# Patient Record
Sex: Male | Born: 1935 | Race: Black or African American | Hispanic: No | Marital: Married | Smoking: Former smoker
Health system: Southern US, Community
[De-identification: ages and names within clinical notes are randomized; demographics above are authoritative.]

## PROBLEM LIST (undated history)

## (undated) DIAGNOSIS — E7800 Pure hypercholesterolemia, unspecified: Secondary | ICD-10-CM

## (undated) DIAGNOSIS — I10 Essential (primary) hypertension: Secondary | ICD-10-CM

## (undated) DIAGNOSIS — C61 Malignant neoplasm of prostate: Secondary | ICD-10-CM

## (undated) HISTORY — PX: PACEMAKER INSERTION: SHX728

## (undated) HISTORY — PX: INSERTION PROSTATE RADIATION SEED: SUR718

---

## 2014-04-07 ENCOUNTER — Emergency Department (HOSPITAL_BASED_OUTPATIENT_CLINIC_OR_DEPARTMENT_OTHER): Payer: BC Managed Care – PPO

## 2014-04-07 ENCOUNTER — Observation Stay (HOSPITAL_BASED_OUTPATIENT_CLINIC_OR_DEPARTMENT_OTHER)
Admission: EM | Admit: 2014-04-07 | Discharge: 2014-04-08 | Disposition: A | Discharge status: Home / Self Care | Payer: BC Managed Care – PPO | Attending: Internal Medicine | Admitting: Internal Medicine

## 2014-04-07 ENCOUNTER — Encounter (HOSPITAL_BASED_OUTPATIENT_CLINIC_OR_DEPARTMENT_OTHER): Payer: Self-pay | Admitting: Emergency Medicine

## 2014-04-07 DIAGNOSIS — E785 Hyperlipidemia, unspecified: Secondary | ICD-10-CM | POA: Diagnosis present

## 2014-04-07 DIAGNOSIS — Z950 Presence of cardiac pacemaker: Secondary | ICD-10-CM | POA: Diagnosis not present

## 2014-04-07 DIAGNOSIS — Z87891 Personal history of nicotine dependence: Secondary | ICD-10-CM | POA: Insufficient documentation

## 2014-04-07 DIAGNOSIS — Z7982 Long term (current) use of aspirin: Secondary | ICD-10-CM | POA: Insufficient documentation

## 2014-04-07 DIAGNOSIS — Z8546 Personal history of malignant neoplasm of prostate: Secondary | ICD-10-CM | POA: Insufficient documentation

## 2014-04-07 DIAGNOSIS — R079 Chest pain, unspecified: Secondary | ICD-10-CM | POA: Diagnosis present

## 2014-04-07 DIAGNOSIS — R1013 Epigastric pain: Secondary | ICD-10-CM | POA: Insufficient documentation

## 2014-04-07 DIAGNOSIS — F1210 Cannabis abuse, uncomplicated: Secondary | ICD-10-CM | POA: Diagnosis not present

## 2014-04-07 DIAGNOSIS — R0789 Other chest pain: Principal | ICD-10-CM | POA: Insufficient documentation

## 2014-04-07 DIAGNOSIS — I10 Essential (primary) hypertension: Secondary | ICD-10-CM | POA: Diagnosis present

## 2014-04-07 HISTORY — DX: Pure hypercholesterolemia, unspecified: E78.00

## 2014-04-07 HISTORY — DX: Malignant neoplasm of prostate: C61

## 2014-04-07 HISTORY — DX: Essential (primary) hypertension: I10

## 2014-04-07 LAB — CBC WITH DIFFERENTIAL/PLATELET
Basophils Absolute: 0 10*3/uL (ref 0.0–0.1)
Basophils Relative: 0 % (ref 0–1)
Eosinophils Absolute: 0.1 10*3/uL (ref 0.0–0.7)
Eosinophils Relative: 2 % (ref 0–5)
HCT: 42.9 % (ref 39.0–52.0)
HEMOGLOBIN: 14.9 g/dL (ref 13.0–17.0)
LYMPHS ABS: 3.5 10*3/uL (ref 0.7–4.0)
LYMPHS PCT: 46 % (ref 12–46)
MCH: 29.6 pg (ref 26.0–34.0)
MCHC: 34.7 g/dL (ref 30.0–36.0)
MCV: 85.3 fL (ref 78.0–100.0)
MONOS PCT: 10 % (ref 3–12)
Monocytes Absolute: 0.7 10*3/uL (ref 0.1–1.0)
NEUTROS PCT: 43 % (ref 43–77)
Neutro Abs: 3.3 10*3/uL (ref 1.7–7.7)
PLATELETS: 210 10*3/uL (ref 150–400)
RBC: 5.03 MIL/uL (ref 4.22–5.81)
RDW: 16.3 % — ABNORMAL HIGH (ref 11.5–15.5)
WBC: 7.6 10*3/uL (ref 4.0–10.5)

## 2014-04-07 LAB — COMPREHENSIVE METABOLIC PANEL
ALK PHOS: 73 U/L (ref 39–117)
ALT: 11 U/L (ref 0–53)
AST: 19 U/L (ref 0–37)
Albumin: 3.9 g/dL (ref 3.5–5.2)
Anion gap: 11 (ref 5–15)
BILIRUBIN TOTAL: 0.8 mg/dL (ref 0.3–1.2)
BUN: 11 mg/dL (ref 6–23)
CO2: 31 mEq/L (ref 19–32)
Calcium: 9.9 mg/dL (ref 8.4–10.5)
Chloride: 102 mEq/L (ref 96–112)
Creatinine, Ser: 1.3 mg/dL (ref 0.50–1.35)
GFR calc non Af Amer: 51 mL/min — ABNORMAL LOW (ref >90)
GFR, EST AFRICAN AMERICAN: 59 mL/min — AB (ref >90)
GLUCOSE: 107 mg/dL — AB (ref 70–99)
POTASSIUM: 4.3 meq/L (ref 3.7–5.3)
Sodium: 144 mEq/L (ref 137–147)
TOTAL PROTEIN: 7.8 g/dL (ref 6.0–8.3)

## 2014-04-07 LAB — TROPONIN I: Troponin I: <0.3 ng/mL (ref <0.30)

## 2014-04-07 MED ORDER — ASPIRIN 81 MG PO CHEW
324.0000 mg | CHEWABLE_TABLET | Freq: Once | ORAL | Status: AC
  Administered 2014-04-07: 324 mg via ORAL
  Filled 2014-04-07: qty 4

## 2014-04-07 MED ORDER — ONDANSETRON HCL 4 MG/2ML IJ SOLN
4.0000 mg | Freq: Once | INTRAMUSCULAR | Status: AC
  Administered 2014-04-07: 4 mg via INTRAVENOUS
  Filled 2014-04-07: qty 2

## 2014-04-07 MED ORDER — FENTANYL CITRATE 0.05 MG/ML IJ SOLN
50.0000 ug | Freq: Once | INTRAMUSCULAR | Status: AC
  Administered 2014-04-07: 50 ug via INTRAVENOUS
  Filled 2014-04-07: qty 2

## 2014-04-07 NOTE — ED Notes (Signed)
C/o abd pain, chest pain x 30 min-vomited x 1

## 2014-04-07 NOTE — ED Provider Notes (Signed)
CSN: [id number]     Arrival date & time 04/07/14  2154 History   None    This chart was scribed for Shaun Hechavarria Alfonso Patten, MD by Forrestine Him, ED Scribe. This patient was seen in room MH12/MH12 and the patient's care was started 11:06 PM.   Chief Complaint  Patient presents with  . Chest Pain  . Abdominal Pain   Patient is a 80 y.o. male presenting with chest pain. The history is provided by the patient. No language interpreter was used.  Chest Pain Pain location:  Substernal area Pain quality: dull   Pain radiates to:  Does not radiate Pain radiates to the back: no   Pain severity:  Severe Onset quality:  Sudden Timing:  Constant Progression:  Unchanged Chronicity:  New Context: at rest   Context: not breathing   Relieved by:  Nothing Worsened by:  Nothing tried Ineffective treatments:  None tried Associated symptoms: abdominal pain and vomiting   Associated symptoms: no fever and no palpitations   Risk factors: no prior DVT/PE     HPI Comments: Shaun Miller is a 80 y.o. male with a PMHx of HTN, high cholesterol, and current pacemaker placement who presents to the Emergency Department complaining of constant, moderate chest pain onset 30 minutes prior to arrival with 1 episode of associated vomiting. He describes discomfort as tight and currently rates it 7-8/10. Pt also reports nausea and some mild SOB. No aggravating or alleviating factors at this time. Wife states he had a "heavy vegetable dinner" he is not typically used to. He was given 1 dose of Ramipril without any improvement for symptoms. No known allergies to medications. Pt is originally from [place] and here for vacation.  [facility] - Cardiology   Past Medical History  Diagnosis Date  . Hypertension   . High cholesterol    Past Surgical History  Procedure Laterality Date  . Pacemaker insertion     No family history on file. History  Substance Use Topics  . Smoking status: Former Research scientist (life sciences)  .  Smokeless tobacco: Not on file  . Alcohol Use: Yes    Review of Systems  Constitutional: Negative for fever and chills.  Cardiovascular: Positive for chest pain. Negative for palpitations and leg swelling.  Gastrointestinal: Positive for vomiting and abdominal pain.  All other systems reviewed and are negative.     Allergies  Review of patient's allergies indicates no known allergies.  Home Medications   Prior to Admission medications   Medication Sig Start Date End Date Taking? Authorizing Provider  aspirin 81 MG tablet Take 81 mg by mouth daily.   Yes Historical Provider, MD  FEXOFENADINE HCL PO Take by mouth.   Yes Historical Provider, MD  METOPROLOL TARTRATE PO Take by mouth.   Yes Historical Provider, MD  RAMIPRIL PO Take by mouth.   Yes Historical Provider, MD  Ranitidine HCl (RANITIDINE ACID REDUCER PO) Take by mouth.   Yes Historical Provider, MD  Rosuvastatin Calcium (CRESTOR PO) Take by mouth.   Yes Historical Provider, MD   Triage Vitals: BP 150/78  Pulse 62  Temp(Src) 98.8 F (37.1 C) (Oral)  Resp 19  Ht 6' (1.829 m)  Wt 145 lb (65.772 kg)  BMI 19.66 kg/m2  SpO2 100%   Physical Exam  Nursing note and vitals reviewed. Constitutional: He is oriented to person, place, and time. He appears well-developed and well-nourished.  HENT:  Head: Normocephalic and atraumatic.  Mouth/Throat: Oropharynx is clear and moist.  Eyes: Conjunctivae  and EOM are normal. Pupils are equal, round, and reactive to light.  Neck: Normal range of motion. Neck supple.  Cardiovascular: Normal rate, regular rhythm, normal heart sounds and intact distal pulses.   Pulmonary/Chest: Effort normal and breath sounds normal. No respiratory distress.  Abdominal: Soft. Bowel sounds are normal. He exhibits no distension. There is no tenderness. There is no rebound and no guarding.  Musculoskeletal: Normal range of motion.  Neurological: He is alert and oriented to person, place, and time. He has  normal reflexes.  Skin: Skin is warm and dry. He is not diaphoretic.  Psychiatric: He has a normal mood and affect. Judgment normal.    ED Course  Procedures (including critical care time)  DIAGNOSTIC STUDIES: Oxygen Saturation is 100% on RA, Normal by my interpretation.    COORDINATION OF CARE: 11:15 PM-Discussed treatment plan with pt at bedside and pt agreed to plan.     Labs Review Labs Reviewed  CBC WITH DIFFERENTIAL - Abnormal; Notable for the following:    RDW 16.3 (*)    All other components within normal limits  TROPONIN I  COMPREHENSIVE METABOLIC PANEL    Imaging Review No results found.   EKG Interpretation None      MDM   Final diagnoses:  None     Date: 04/07/2014  Rate: 60  Rhythm: paced  QRS Axis: left  Intervals: PR prolonged  ST/T Wave abnormalities: nonspecific ST changes  Conduction Disutrbances:left bundle branch block  Narrative Interpretation:   Old EKG Reviewed: none available    I personally performed the services described in this documentation, which was scribed in my presence. The recorded information has been reviewed and is accurate.    Carlisle Beers, MD 04/07/14 [phone]

## 2014-04-08 ENCOUNTER — Encounter (HOSPITAL_COMMUNITY): Payer: Self-pay | Admitting: Internal Medicine

## 2014-04-08 DIAGNOSIS — E785 Hyperlipidemia, unspecified: Secondary | ICD-10-CM | POA: Diagnosis present

## 2014-04-08 DIAGNOSIS — R079 Chest pain, unspecified: Secondary | ICD-10-CM

## 2014-04-08 DIAGNOSIS — R0789 Other chest pain: Secondary | ICD-10-CM | POA: Diagnosis not present

## 2014-04-08 DIAGNOSIS — I10 Essential (primary) hypertension: Secondary | ICD-10-CM | POA: Diagnosis not present

## 2014-04-08 DIAGNOSIS — R1013 Epigastric pain: Secondary | ICD-10-CM | POA: Diagnosis not present

## 2014-04-08 LAB — CBC WITH DIFFERENTIAL/PLATELET
Basophils Absolute: 0 10*3/uL (ref 0.0–0.1)
Basophils Relative: 0 % (ref 0–1)
Eosinophils Absolute: 0 10*3/uL (ref 0.0–0.7)
Eosinophils Relative: 0 % (ref 0–5)
HCT: 41 % (ref 39.0–52.0)
HEMOGLOBIN: 13.9 g/dL (ref 13.0–17.0)
LYMPHS ABS: 1.2 10*3/uL (ref 0.7–4.0)
Lymphocytes Relative: 13 % (ref 12–46)
MCH: 29.6 pg (ref 26.0–34.0)
MCHC: 33.9 g/dL (ref 30.0–36.0)
MCV: 87.4 fL (ref 78.0–100.0)
MONOS PCT: 8 % (ref 3–12)
Monocytes Absolute: 0.7 10*3/uL (ref 0.1–1.0)
NEUTROS ABS: 7 10*3/uL (ref 1.7–7.7)
Neutrophils Relative %: 79 % — ABNORMAL HIGH (ref 43–77)
Platelets: 185 10*3/uL (ref 150–400)
RBC: 4.69 MIL/uL (ref 4.22–5.81)
RDW: 15.6 % — ABNORMAL HIGH (ref 11.5–15.5)
WBC: 8.8 10*3/uL (ref 4.0–10.5)

## 2014-04-08 LAB — COMPREHENSIVE METABOLIC PANEL
ALBUMIN: 3.4 g/dL — AB (ref 3.5–5.2)
ALT: 10 U/L (ref 0–53)
ANION GAP: 12 (ref 5–15)
AST: 16 U/L (ref 0–37)
Alkaline Phosphatase: 66 U/L (ref 39–117)
BUN: 12 mg/dL (ref 6–23)
CALCIUM: 8.6 mg/dL (ref 8.4–10.5)
CO2: 26 mEq/L (ref 19–32)
Chloride: 102 mEq/L (ref 96–112)
Creatinine, Ser: 1.13 mg/dL (ref 0.50–1.35)
GFR calc Af Amer: 69 mL/min — ABNORMAL LOW (ref >90)
GFR calc non Af Amer: 60 mL/min — ABNORMAL LOW (ref >90)
GLUCOSE: 96 mg/dL (ref 70–99)
Potassium: 4.3 mEq/L (ref 3.7–5.3)
SODIUM: 140 meq/L (ref 137–147)
TOTAL PROTEIN: 6.6 g/dL (ref 6.0–8.3)
Total Bilirubin: 1 mg/dL (ref 0.3–1.2)

## 2014-04-08 LAB — LIPASE, BLOOD: LIPASE: 18 U/L (ref 11–59)

## 2014-04-08 LAB — TROPONIN I: Troponin I: <0.3 ng/mL (ref <0.30)

## 2014-04-08 LAB — TSH: TSH: 0.485 u[IU]/mL (ref 0.350–4.500)

## 2014-04-08 LAB — MRSA PCR SCREENING: MRSA BY PCR: NEGATIVE

## 2014-04-08 MED ORDER — ASPIRIN EC 325 MG PO TBEC
325.0000 mg | DELAYED_RELEASE_TABLET | Freq: Every day | ORAL | Status: DC
  Administered 2014-04-08: 325 mg via ORAL
  Filled 2014-04-08: qty 1

## 2014-04-08 MED ORDER — RAMIPRIL 5 MG PO CAPS
5.0000 mg | ORAL_CAPSULE | Freq: Every day | ORAL | Status: DC
  Administered 2014-04-08: 5 mg via ORAL
  Filled 2014-04-08: qty 1

## 2014-04-08 MED ORDER — ONDANSETRON HCL 4 MG/2ML IJ SOLN: 4.0000 mg | Freq: Four times a day (QID) | INTRAMUSCULAR | Status: DC | PRN

## 2014-04-08 MED ORDER — SODIUM CHLORIDE 0.9 % IV SOLN
INTRAVENOUS | Status: DC
  Administered 2014-04-08: 08:00:00 via INTRAVENOUS

## 2014-04-08 MED ORDER — ONDANSETRON HCL 4 MG PO TABS
4.0000 mg | ORAL_TABLET | Freq: Four times a day (QID) | ORAL | Status: DC | PRN
Start: 2014-04-08 — End: 2014-04-08

## 2014-04-08 MED ORDER — METOPROLOL SUCCINATE ER 50 MG PO TB24
50.0000 mg | ORAL_TABLET | Freq: Every day | ORAL | Status: DC
  Filled 2014-04-08: qty 1

## 2014-04-08 MED ORDER — ACETAMINOPHEN 325 MG PO TABS
650.0000 mg | ORAL_TABLET | Freq: Four times a day (QID) | ORAL | Status: DC | PRN
  Administered 2014-04-08: 650 mg via ORAL
  Filled 2014-04-08: qty 2

## 2014-04-08 MED ORDER — PANTOPRAZOLE SODIUM 40 MG IV SOLR
40.0000 mg | INTRAVENOUS | Status: DC
Start: 2014-04-08 — End: 2014-04-08
  Administered 2014-04-08: 40 mg via INTRAVENOUS
  Filled 2014-04-08: qty 40

## 2014-04-08 MED ORDER — ASPIRIN 81 MG PO CHEW: 81.0000 mg | CHEWABLE_TABLET | Freq: Every day | ORAL | Status: DC

## 2014-04-08 MED ORDER — RAMIPRIL 5 MG PO CAPS
5.0000 mg | ORAL_CAPSULE | Freq: Every day | ORAL | Status: DC
  Filled 2014-04-08: qty 1

## 2014-04-08 MED ORDER — ACETAMINOPHEN 650 MG RE SUPP: 650.0000 mg | Freq: Four times a day (QID) | RECTAL | Status: DC | PRN

## 2014-04-08 MED ORDER — ENOXAPARIN SODIUM 40 MG/0.4ML ~~LOC~~ SOLN
40.0000 mg | SUBCUTANEOUS | Status: DC
  Filled 2014-04-08: qty 0.4

## 2014-04-08 MED ORDER — PANTOPRAZOLE SODIUM 40 MG PO TBEC: 40.0000 mg | DELAYED_RELEASE_TABLET | Freq: Every day | ORAL | Status: DC

## 2014-04-08 MED ORDER — MORPHINE SULFATE 2 MG/ML IJ SOLN: 1.0000 mg | INTRAMUSCULAR | Status: DC | PRN

## 2014-04-08 MED ORDER — NITROGLYCERIN 0.4 MG/HR TD PT24
0.4000 mg | MEDICATED_PATCH | Freq: Every day | TRANSDERMAL | Status: DC
  Filled 2014-04-08: qty 1

## 2014-04-08 MED ORDER — ATORVASTATIN CALCIUM 10 MG PO TABS
10.0000 mg | ORAL_TABLET | Freq: Every day | ORAL | Status: DC
  Filled 2014-04-08: qty 1

## 2014-04-08 NOTE — Progress Notes (Signed)
Pt discharged via wheelchair, education completed and went over medication list.  Ledyard Blas. Dalbert Batman, RN, BSN 04/08/2014 2:11 PM

## 2014-04-08 NOTE — Progress Notes (Signed)
Pt seen and examined,  Admitted earlier this am with Atypical chest pressure and epigastric pain -reported has had LHC and Stress tests in [place] in the last 5-10years, Cardiologist is Dr.Khalighi Koroush in [place], request records -will keep NPO, request Cards eval, cycle cardiac enzymes -check RUQ [place], LFTs normal, lipase pending  Domenic Polite, MD [phone]

## 2014-04-08 NOTE — H&P (Signed)
[facility] History and Physical  Shaun Miller KKX:[id number] DOB: [birth year] DOA: 04/07/2014  Referring physician: ER physician. PCP: PROVIDER NOT IN SYSTEM   Patient is visiting here from [place] for a reunion. Patient's cardiologist is in [place].  Chief Complaint: Chest pain.  HPI: Shaun Miller is a 80 y.o. male with history of pacemaker placement, hypertension, hyperlipidemia presented to the ER with complaints of chest pain. Patient was having dinner last night around 7:30 PM when he started developing abdominal discomfort which was radiating all the way to the chest and patient had thrown up once. Denies any associated shortness of breath cough fever chills. Pain was retrosternal persistent. In the ER as per the ER physician patient's EKG was unremarkable and cardiac markers were negative but patient had persistent chest pain. On exam pain still has chest pain is retrosternal nonradiating pressure-like. Patient states his abdominal pain is resolved. Has not had any previous episodes of chest pain as per the patient and has had previous stress test done the date of which patient is not able to exactly recall.   Review of Systems: As presented in the history of presenting illness, rest negative.  Past Medical History  Diagnosis Date  . Hypertension   . High cholesterol   . Prostate cancer    Past Surgical History  Procedure Laterality Date  . Pacemaker insertion    . Insertion prostate radiation seed     Social History:  reports that he has quit smoking. He does not have any smokeless tobacco history on file. He reports that he drinks alcohol. He reports that he uses illicit drugs (Marijuana). Where does patient live home. Can patient participate in ADLs? Yes.  No Known Allergies  Family History:  Family History  Problem Relation Age of Onset  . Stroke Mother       Prior to Admission medications   Medication Sig Start Date End Date Taking?  Authorizing Provider  aspirin 81 MG tablet Take 81 mg by mouth daily.   Yes Historical Provider, MD  FEXOFENADINE HCL PO Take by mouth.   Yes Historical Provider, MD  METOPROLOL TARTRATE PO Take by mouth.   Yes Historical Provider, MD  RAMIPRIL PO Take by mouth.   Yes Historical Provider, MD  Ranitidine HCl (RANITIDINE ACID REDUCER PO) Take by mouth.   Yes Historical Provider, MD  Rosuvastatin Calcium (CRESTOR PO) Take by mouth.   Yes Historical Provider, MD    Physical Exam: Filed Vitals:   04/08/14 0013 04/08/14 0300 04/08/14 0400 04/08/14 0500  BP: 133/73 143/71 144/65 133/59  Pulse: 69 68 70 66  Temp:  98.2 F (36.8 C) 98.1 F (36.7 C)   TempSrc:  Oral Oral   Resp:  15 15 15   Height:  6' (1.829 m)    Weight:  66.9 kg (147 lb 7.8 oz)    SpO2: 99% 98% 97% 97%     General:  Moderately developed and nourished.  Eyes: Anicteric no pallor.  ENT: No discharge from ears eyes nose mouth.  Neck: No mass felt.  Cardiovascular: S1-S2 heard.  Respiratory: No rhonchi or crepitations.  Abdomen: Soft nontender bowel sounds present. No guarding rigidity.  Skin: No rash.  Musculoskeletal: No edema.  Psychiatric: Appears normal.  Neurologic: Alert awake oriented time place and person. Moves all extremities.  Labs on Admission:  Basic Metabolic Panel:  Recent Labs Lab 04/07/14 2210  NA 144  K 4.3  CL 102  CO2 31  GLUCOSE 107*  BUN 11  CREATININE 1.30  CALCIUM 9.9   Liver Function Tests:  Recent Labs Lab 04/07/14 2210  AST 19  ALT 11  ALKPHOS 73  BILITOT 0.8  PROT 7.8  ALBUMIN 3.9   No results found for this basename: LIPASE, AMYLASE,  in the last 168 hours No results found for this basename: AMMONIA,  in the last 168 hours CBC:  Recent Labs Lab 04/07/14 2210  WBC 7.6  NEUTROABS 3.3  HGB 14.9  HCT 42.9  MCV 85.3  PLT 210   Cardiac Enzymes:  Recent Labs Lab 04/07/14 2210  TROPONINI <0.30    BNP (last 3 results) No results found for this  basename: PROBNP,  in the last 8760 hours CBG: No results found for this basename: GLUCAP,  in the last 168 hours  Radiological Exams on Admission: Dg Chest 2 View  04/07/2014   CLINICAL DATA:  Chest pain.  EXAM: CHEST  2 VIEW  COMPARISON:  No priors.  FINDINGS: Lung volumes are normal. No consolidative airspace disease. No pleural effusions. No pneumothorax. No pulmonary nodule or mass noted. Pulmonary vasculature and the cardiomediastinal silhouette are within normal limits. Left-sided pacemaker in position with lead tips projecting over the expected location of the right atrium and right ventricular apex.  IMPRESSION: No radiographic evidence of acute cardiopulmonary disease.   Electronically Signed   By: Vinnie Langton M.D.   On: 04/07/2014 23:23     Assessment/Plan Active Problems:   Chest pain   HTN (hypertension)   HLD (hyperlipidemia)   1. Chest pain - patient still has chest pain I have placed patient on nitroglycerin patch. When necessary morphine. Aspirin. Cycle cardiac markers and check 2-D echo continue statins and metoprolol. Keep patient n.p.o. except medications in anticipation of possible procedure. And Protonix check lipase. Patient's EKG is has no I have ordered another EKG stat now. 2. Hypertension - continue ramipril and metoprolol. 3. Hyperlipidemia - on statins. 4. History of pacemaker placement.    Code Status: Full code.  Family Communication: Patient's wife at the bedside.  Disposition Plan: Admit for observation.    Shaun Miller N. [facility] Pager [phone].  If 7PM-7AM, please contact night-coverage [email or web address] Password [facility] 04/08/2014, 6:49 AM

## 2014-04-08 NOTE — Discharge Summary (Addendum)
Physician Discharge Summary  Shaun Miller YIR:[id number] DOB: 1932/10/22 DOA: 04/07/2014  PCP: PROVIDER NOT IN SYSTEM  Admit date: 04/07/2014 Discharge date: 04/08/2014  Time spent: 35 minutes  Recommendations for Outpatient Follow-up:  1. Cardiologist Dr.Khalighi Koroush in 2 weeks  Discharge Diagnoses:  Active Problems:   Atypical Chest/Epigastric  pain   HTN (hypertension)   HLD (hyperlipidemia)   Discharge Condition: stable  Diet recommendation: heart healthy   Filed Weights   04/07/14 2202 04/08/14 0300  Weight: 65.772 kg (145 lb) 66.9 kg (147 lb 7.8 oz)    History of present illness:  Chief Complaint: Chest pain.  HPI: Shaun Miller is a 80 y.o. male with history of pacemaker placement, hypertension, hyperlipidemia presented to the ER with complaints of chest pain. Patient was having dinner last night around 7:30 PM when he started developing abdominal discomfort which was radiating all the way to the chest and patient had thrown up once. Denies any associated shortness of breath cough fever chills. Pain was retrosternal persistent. In the ER as per the ER physician patient's EKG was unremarkable and cardiac markers were negative but patient had persistent chest pain. On exam pain still has chest pain is retrosternal nonradiating pressure-like. Patient states his abdominal pain is resolved. Has not had any previous episodes of chest pain as per the patient and has had previous stress test done the date of which patient is not able to exactly recall.    Hospital Course:  Admitted earlier this am with Atypical chest pressure and epigastric pain -reported has had LHC and Stress tests in [place] in the last 5-10years, Cardiologist is Dr.Khalighi Koroush in [place]. -this started after eating dinner last pm, very atypical and improved. -Also seen by Cardiology in consultation, felt to be non cardiac and no further workup recommended at this time -cardiac enzymes cycled and negative,  EKG without acute ST T wave changes. -suspect GI origin to this pain which has improved, no further inpatient workup recommended at this time.   Consultations:  Cardiology  Discharge Exam: Filed Vitals:   04/08/14 1122  BP: 128/65  Pulse: 65  Temp: 98.1 F (36.7 C)  Resp: 18    General: AAOx3 Cardiovascular: S1S2/RRR Respiratory: CTAB  Discharge Instructions You were cared for by a hospitalist during your hospital stay. If you have any questions about your discharge medications or the care you received while you were in the hospital after you are discharged, you can call the unit and asked to speak with the hospitalist on call if the hospitalist that took care of you is not available. Once you are discharged, your primary care physician will handle any further medical issues. Please note that NO REFILLS for any discharge medications will be authorized once you are discharged, as it is imperative that you return to your primary care physician (or establish a relationship with a primary care physician if you do not have one) for your aftercare needs so that they can reassess your need for medications and monitor your lab values.  Discharge Instructions   Diet - low sodium heart healthy    Complete by:  As directed      Increase activity slowly    Complete by:  As directed             Medication List         aspirin 81 MG tablet  Take 81 mg by mouth daily.     latanoprost 0.005 % ophthalmic solution  Commonly known as:  XALATAN  Place 1 drop into both eyes at bedtime.     ramipril 5 MG capsule  Commonly known as:  ALTACE  Take 5 mg by mouth daily.       No Known Allergies     Follow-up Information   Follow up with Dr.Khalighi Koroush. Schedule an appointment as soon as possible for a visit in 2 weeks.       The results of significant diagnostics from this hospitalization (including imaging, microbiology, ancillary and laboratory) are listed below for reference.     Significant Diagnostic Studies: Dg Chest 2 View  04/07/2014   CLINICAL DATA:  Chest pain.  EXAM: CHEST  2 VIEW  COMPARISON:  No priors.  FINDINGS: Lung volumes are normal. No consolidative airspace disease. No pleural effusions. No pneumothorax. No pulmonary nodule or mass noted. Pulmonary vasculature and the cardiomediastinal silhouette are within normal limits. Left-sided pacemaker in position with lead tips projecting over the expected location of the right atrium and right ventricular apex.  IMPRESSION: No radiographic evidence of acute cardiopulmonary disease.   Electronically Signed   By: Vinnie Langton M.D.   On: 04/07/2014 23:23    Microbiology: Recent Results (from the past 240 hour(s))  MRSA PCR SCREENING     Status: None   Collection Time    04/08/14  6:56 AM      Result Value Ref Range Status   MRSA by PCR NEGATIVE  NEGATIVE Final   Comment:            The GeneXpert MRSA Assay (FDA     approved for NASAL specimens     only), is one component of a     comprehensive MRSA colonization     surveillance program. It is not     intended to diagnose MRSA     infection nor to guide or     monitor treatment for     MRSA infections.     Labs: Basic Metabolic Panel:  Recent Labs Lab 04/07/14 2210 04/08/14 0749  NA 144 140  K 4.3 4.3  CL 102 102  CO2 31 26  GLUCOSE 107* 96  BUN 11 12  CREATININE 1.30 1.13  CALCIUM 9.9 8.6   Liver Function Tests:  Recent Labs Lab 04/07/14 2210 04/08/14 0749  AST 19 16  ALT 11 10  ALKPHOS 73 66  BILITOT 0.8 1.0  PROT 7.8 6.6  ALBUMIN 3.9 3.4*    Recent Labs Lab 04/08/14 0749  LIPASE 18   No results found for this basename: AMMONIA,  in the last 168 hours CBC:  Recent Labs Lab 04/07/14 2210 04/08/14 0749  WBC 7.6 8.8  NEUTROABS 3.3 7.0  HGB 14.9 13.9  HCT 42.9 41.0  MCV 85.3 87.4  PLT 210 185   Cardiac Enzymes:  Recent Labs Lab 04/07/14 2210 04/08/14 0749  TROPONINI <0.30 <0.30   BNP: BNP (last 3  results) No results found for this basename: PROBNP,  in the last 8760 hours CBG: No results found for this basename: GLUCAP,  in the last 168 hours     Signed:  Naziah Portee  [facility] 04/08/2014, 1:34 PM

## 2014-04-08 NOTE — Consult Note (Signed)
CARDIOLOGY CONSULT NOTE       Patient ID: Ezekiel Menzer MRN: [id number] DOB/AGE: 78-26-1934 78 y.o.  Admit date: 04/07/2014 Referring Physician:  Broadus John Primary Physician: PROVIDER NOT [place] Primary Cardiologist:  [place]  Reason for Consultation: Chest pain  Active Problems:   Chest pain   HTN (hypertension)   HLD (hyperlipidemia)   HPI:   Pleasant 80 yo from [place].  Traveling to see friends with wife.  About 30 minutes after eating a bunch of vegetables had lower bilateral abdominal pain.  Radiated up to mid stomach and chest.  Nausea and then vohmiting.  No dyspnea.  Has had stomach issues/ GERD in past helped with Aciphex.  No melena or diarrhea.  Now with pain on either side No pleuritic component.  Has a pacemaker.  Generator changed last year  Originally implanted 2005.  No history of CAD.  Had stress test in last 5 years normal.  Had echo last month was "good".  CRF;s  HTN and elevated lipids on Rx   ROS All other systems reviewed and negative except as noted above  Past Medical History  Diagnosis Date  . Hypertension   . High cholesterol   . Prostate cancer     Family History  Problem Relation Age of Onset  . Stroke Mother     History   Social History  . Marital Status: Married    Spouse Name: N/A    Number of Children: N/A  . Years of Education: N/A   Occupational History  . Not on file.   Social History Main Topics  . Smoking status: Former Research scientist (life sciences)  . Smokeless tobacco: Not on file  . Alcohol Use: Yes  . Drug Use: Yes    Special: Marijuana  . Sexual Activity: Not on file   Other Topics Concern  . Not on file   Social History Narrative  . No narrative on file    Past Surgical History  Procedure Laterality Date  . Pacemaker insertion    . Insertion prostate radiation seed       . aspirin EC  325 mg Oral Daily  . atorvastatin  10 mg Oral q1800  . enoxaparin (LOVENOX) injection  40 mg Subcutaneous Q24H  . metoprolol succinate  50 mg Oral Daily  .  nitroGLYCERIN  0.4 mg Transdermal Daily  . pantoprazole (PROTONIX) IV  40 mg Intravenous Q24H  . ramipril  5 mg Oral Daily   . sodium chloride 10 mL/hr at 04/08/14 0753    Physical Exam: Blood pressure 123/67, pulse 68, temperature 97.6 F (36.4 C), temperature source Oral, resp. rate 20, height 6' (1.829 m), weight 147 lb 7.8 oz (66.9 kg), SpO2 100.00%.   Affect appropriate Healthy:  appears stated age 80: normal Neck supple with no adenopathy JVP normal no bruits no thyromegaly Lungs clear with no wheezing and good diaphragmatic motion Heart:  S1/S2 SEM  murmur, no rub, gallop or click PMI normal Abdomen: benighn, BS positve, no tenderness, no AAA no bruit.  No HSM or HJR Distal pulses intact with no bruits No edema Neuro non-focal Skin warm and dry No muscular weakness Pacer under left clavicle    Labs:   Lab Results  Component Value Date   WBC 7.6 04/07/2014   HGB 14.9 04/07/2014   HCT 42.9 04/07/2014   MCV 85.3 04/07/2014   PLT 210 04/07/2014    Recent Labs Lab 04/07/14 2210  NA 144  K 4.3  CL 102  CO2 31  BUN 11  CREATININE 1.30  CALCIUM 9.9  PROT 7.8  BILITOT 0.8  ALKPHOS 73  ALT 11  AST 19  GLUCOSE 107*   Lab Results  Component Value Date   TROPONINI <0.30 04/07/2014       Radiology: Dg Chest 2 View  04/07/2014   CLINICAL DATA:  Chest pain.  EXAM: CHEST  2 VIEW  COMPARISON:  No priors.  FINDINGS: Lung volumes are normal. No consolidative airspace disease. No pleural effusions. No pneumothorax. No pulmonary nodule or mass noted. Pulmonary vasculature and the cardiomediastinal silhouette are within normal limits. Left-sided pacemaker in position with lead tips projecting over the expected location of the right atrium and right ventricular apex.  IMPRESSION: No radiographic evidence of acute cardiopulmonary disease.   Electronically Signed   By: Vinnie Langton M.D.   On: 04/07/2014 23:23    EKG: Intermitant A pacing RBBB no acute ST  changes   ASSESSMENT AND PLAN:  Chest Pain:  Totally atypical with no history of CAD.  Rx GERD and indigestion advance diet.  Has r/o and echo last month ok.  No need for further inpatient testing  Offered  ETT/CT but patient and wife Would prefer to f/u with cardiologist in [place] who is a friend.  Pain is sufficiently atypical where I think this is ok Pacer:  Normal function new generator last year  Not likely pacer dependant with A pacing HTN:  Continue ACE Chol:  Continue statin   Signed: Jenkins Rouge 04/08/2014, 8:11 AM

## 2015-10-25 IMAGING — CR DG CHEST 2V
2 series · 2 of 2 positions shown · non-contrast
Comparison: No priors.

CLINICAL DATA: Chest pain.

EXAM:
CHEST  2 VIEW

[w chest pa]
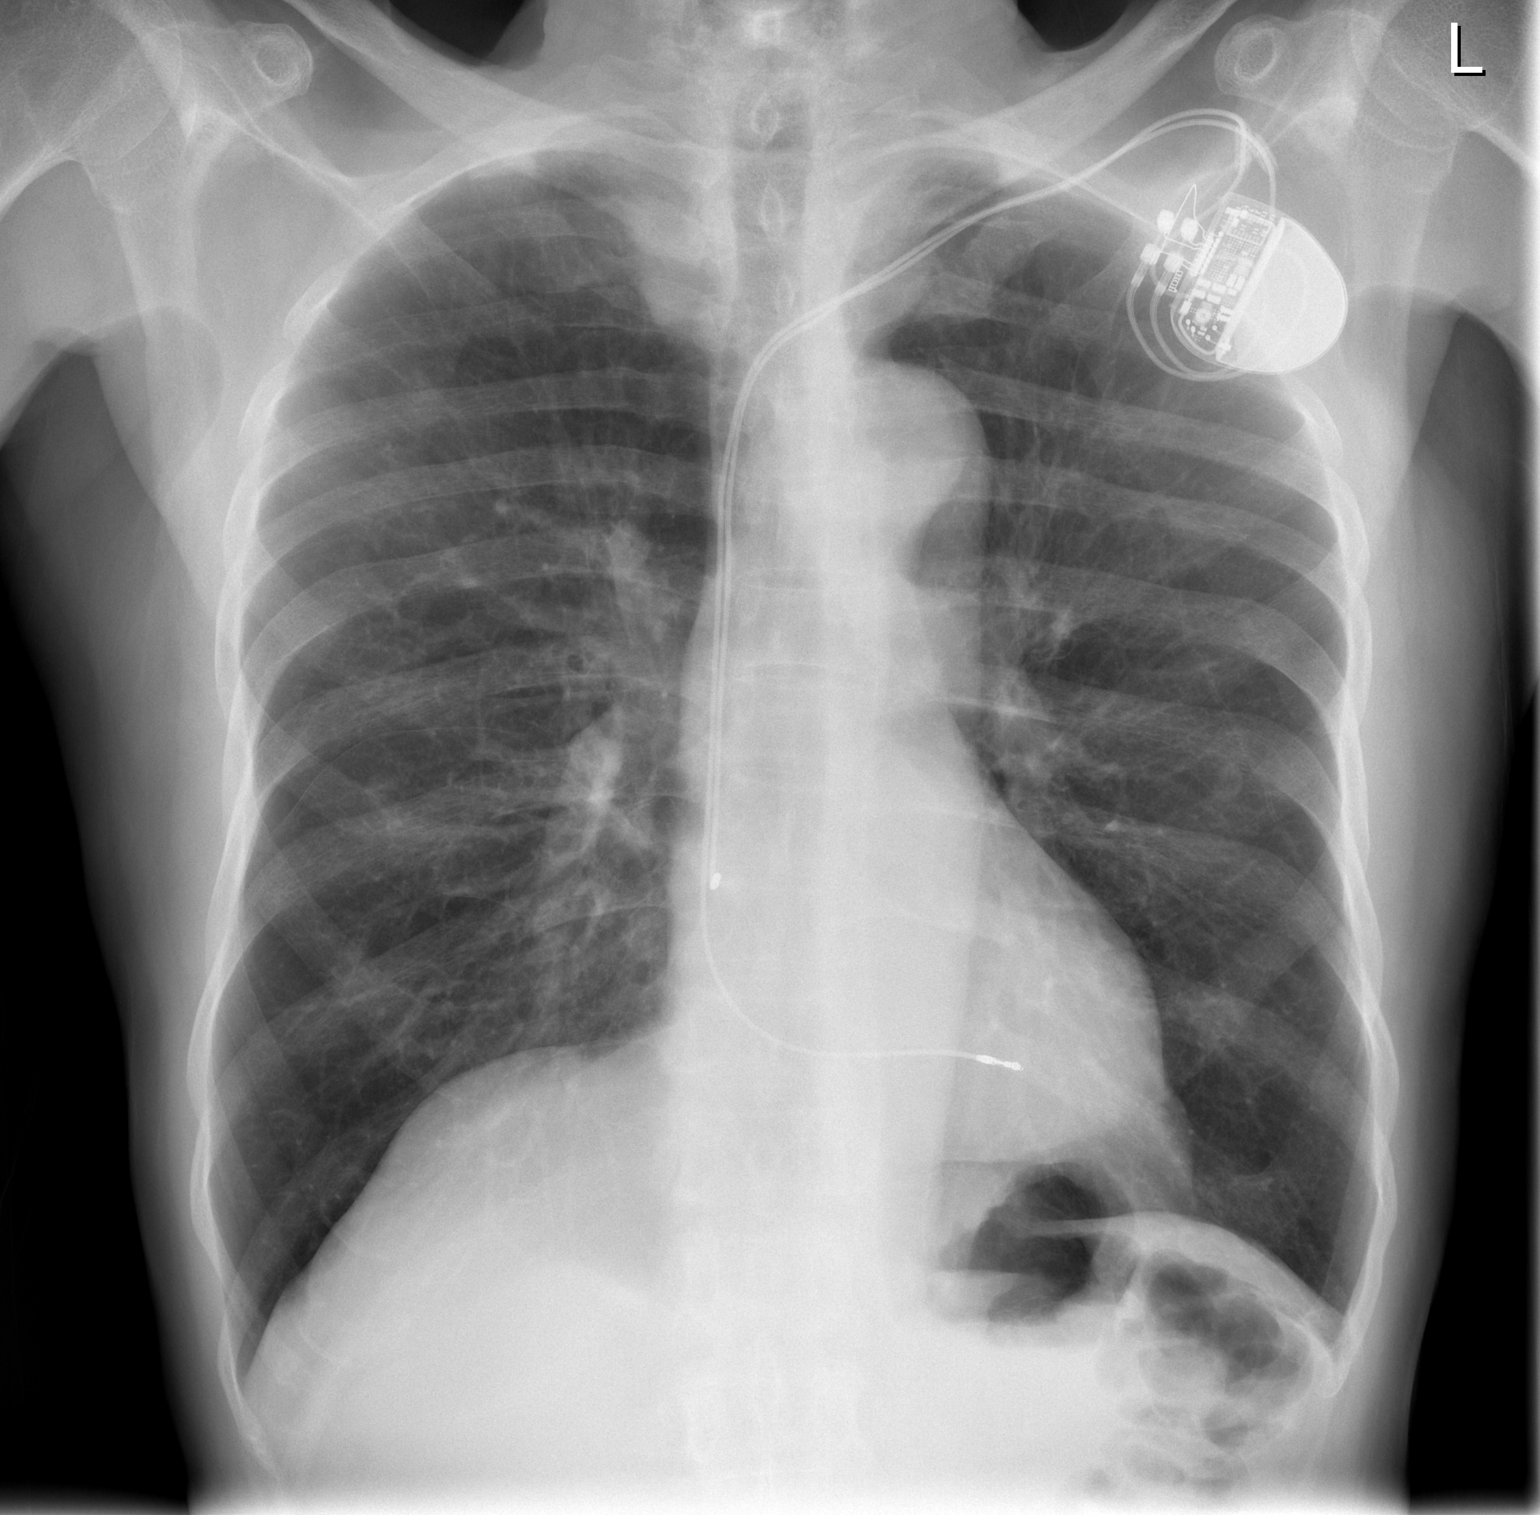

[w chest lat]
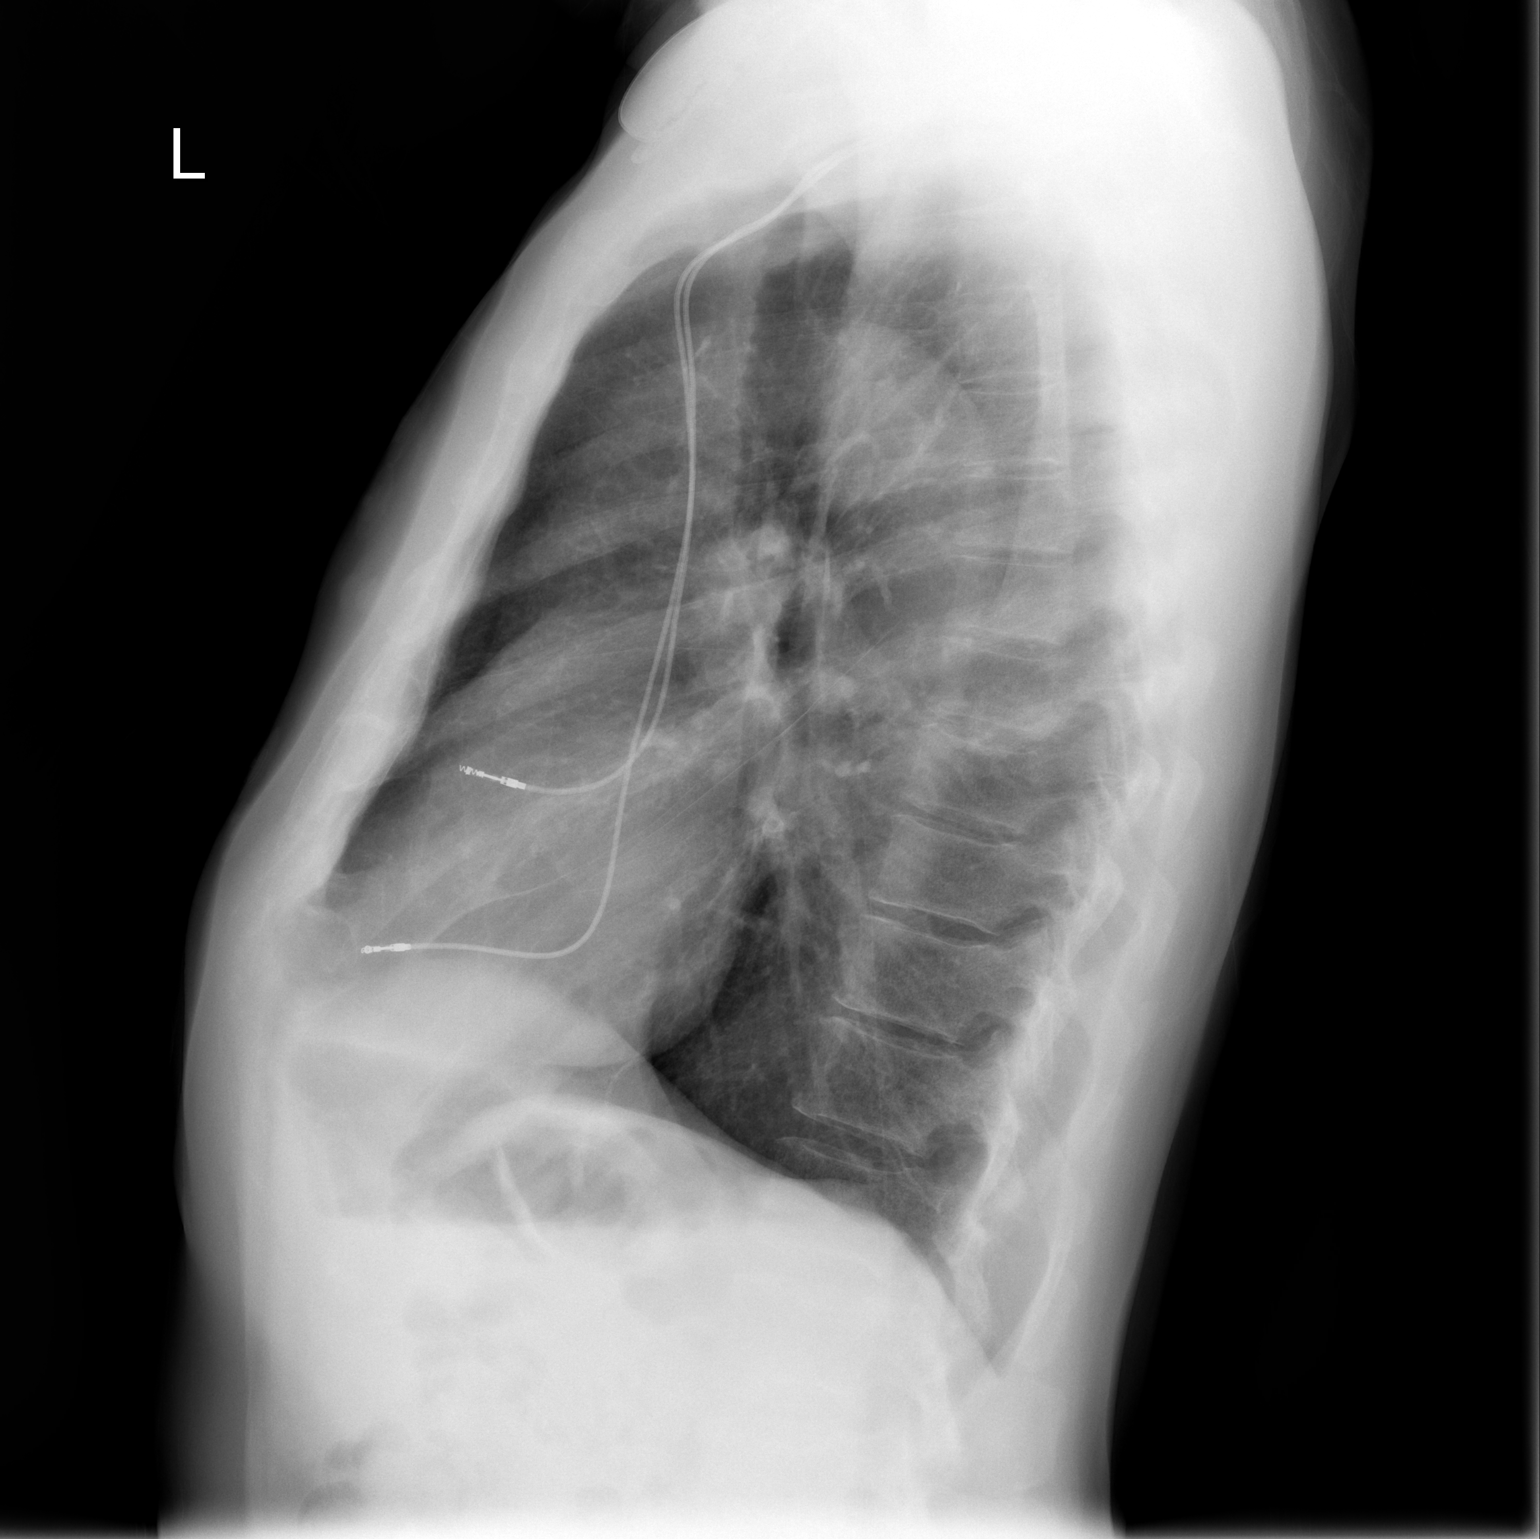

[2 of 2 positions shown; findings below may reference images not displayed]

FINDINGS: Lung volumes are normal. No consolidative airspace disease. No
pleural effusions. No pneumothorax. No pulmonary nodule or mass
noted. Pulmonary vasculature and the cardiomediastinal silhouette
are within normal limits. Left-sided pacemaker in position with lead
tips projecting over the expected location of the right atrium and
right ventricular apex.
IMPRESSION: No radiographic evidence of acute cardiopulmonary disease.
